# Patient Record
Sex: Female | Born: 1975
Health system: Southern US, Community
[De-identification: ages and names within clinical notes are randomized; demographics above are authoritative.]

## PROBLEM LIST (undated history)

## (undated) ENCOUNTER — Emergency Department (HOSPITAL_COMMUNITY): Payer: No Typology Code available for payment source

## (undated) DIAGNOSIS — Z789 Other specified health status: Secondary | ICD-10-CM

## (undated) HISTORY — PX: NO PAST SURGERIES: SHX2092

## (undated) HISTORY — DX: Other specified health status: Z78.9

---

## 1998-11-04 ENCOUNTER — Emergency Department (HOSPITAL_COMMUNITY): Admission: EM | Admit: 1998-11-04 | Discharge: 1998-11-04 | Payer: Self-pay | Admitting: Emergency Medicine

## 1999-06-19 ENCOUNTER — Other Ambulatory Visit: Admission: RE | Admit: 1999-06-19 | Discharge: 1999-06-19 | Payer: Self-pay | Admitting: Internal Medicine

## 2000-10-20 ENCOUNTER — Other Ambulatory Visit: Admission: RE | Admit: 2000-10-20 | Discharge: 2000-10-20 | Payer: Self-pay | Admitting: Internal Medicine

## 2001-01-04 ENCOUNTER — Other Ambulatory Visit: Admission: RE | Admit: 2001-01-04 | Discharge: 2001-01-04 | Payer: Self-pay | Admitting: Internal Medicine

## 2001-06-26 ENCOUNTER — Emergency Department (HOSPITAL_COMMUNITY): Admission: EM | Admit: 2001-06-26 | Discharge: 2001-06-26 | Payer: Self-pay

## 2001-11-04 ENCOUNTER — Inpatient Hospital Stay (HOSPITAL_COMMUNITY): Admission: AD | Admit: 2001-11-04 | Discharge: 2001-11-04 | Payer: Self-pay | Admitting: *Deleted

## 2001-12-09 ENCOUNTER — Other Ambulatory Visit: Admission: RE | Admit: 2001-12-09 | Discharge: 2001-12-09 | Payer: Self-pay | Admitting: *Deleted

## 2002-07-03 ENCOUNTER — Inpatient Hospital Stay (HOSPITAL_COMMUNITY): Admission: AD | Admit: 2002-07-03 | Discharge: 2002-07-05 | Payer: Self-pay | Admitting: *Deleted

## 2003-01-24 ENCOUNTER — Emergency Department (HOSPITAL_COMMUNITY): Admission: EM | Admit: 2003-01-24 | Discharge: 2003-01-24 | Payer: Self-pay | Admitting: Emergency Medicine

## 2003-12-24 ENCOUNTER — Ambulatory Visit (HOSPITAL_COMMUNITY): Admission: RE | Admit: 2003-12-24 | Discharge: 2003-12-24 | Payer: Self-pay | Admitting: Obstetrics

## 2004-09-13 ENCOUNTER — Emergency Department (HOSPITAL_COMMUNITY): Admission: EM | Admit: 2004-09-13 | Discharge: 2004-09-13 | Payer: Self-pay | Admitting: Emergency Medicine

## 2005-02-14 ENCOUNTER — Emergency Department (HOSPITAL_COMMUNITY): Admission: EM | Admit: 2005-02-14 | Discharge: 2005-02-14 | Payer: Self-pay | Admitting: Emergency Medicine

## 2005-09-07 ENCOUNTER — Emergency Department (HOSPITAL_COMMUNITY): Admission: EM | Admit: 2005-09-07 | Discharge: 2005-09-08 | Payer: Self-pay | Admitting: Emergency Medicine

## 2005-11-03 ENCOUNTER — Emergency Department (HOSPITAL_COMMUNITY): Admission: EM | Admit: 2005-11-03 | Discharge: 2005-11-03 | Payer: Self-pay | Admitting: *Deleted

## 2005-11-05 ENCOUNTER — Encounter: Admission: RE | Admit: 2005-11-05 | Discharge: 2005-11-05 | Payer: Self-pay | Admitting: General Practice

## 2005-11-09 ENCOUNTER — Encounter: Admission: RE | Admit: 2005-11-09 | Discharge: 2005-11-26 | Payer: Self-pay | Admitting: General Practice

## 2009-05-30 ENCOUNTER — Ambulatory Visit (HOSPITAL_COMMUNITY): Admission: RE | Admit: 2009-05-30 | Discharge: 2009-05-30 | Payer: Self-pay | Admitting: Obstetrics & Gynecology

## 2009-08-20 ENCOUNTER — Ambulatory Visit (HOSPITAL_COMMUNITY): Admission: RE | Admit: 2009-08-20 | Discharge: 2009-08-20 | Payer: Self-pay | Admitting: Obstetrics & Gynecology

## 2009-10-27 ENCOUNTER — Inpatient Hospital Stay (HOSPITAL_COMMUNITY): Admission: AD | Admit: 2009-10-27 | Discharge: 2009-10-29 | Payer: Self-pay | Admitting: Obstetrics

## 2010-04-24 LAB — CBC
HCT: 38 % (ref 36.0–46.0)
Hemoglobin: 12.7 g/dL (ref 12.0–15.0)
MCH: 31.6 pg (ref 26.0–34.0)
MCHC: 33.4 g/dL (ref 30.0–36.0)
RDW: 15.3 % (ref 11.5–15.5)

## 2010-04-24 LAB — RPR: RPR Ser Ql: NONREACTIVE

## 2012-02-10 NOTE — L&D Delivery Note (Signed)
Delivery Note At 6:34 AM a viable female was delivered by nurse midwife precipitously on admission to triage via Vaginal, Spontaneous Delivery (Presentation: vertex ;  ).  APGAR: 8, 9; weight .   Placenta status: , Spontaneous.  Cord: 3 vessels with the following complications: None.  Cord pH: none  Anesthesia: None  Episiotomy: None Lacerations: 2nd degree Suture Repair: 3.0 vicryl rapide Est. Blood Loss (mL): 600  Mom to postpartum.  Baby to Couplet care / Skin to Skin.  Jamey Harman A 12/14/2012, 7:47 AM

## 2012-05-12 ENCOUNTER — Encounter: Payer: Self-pay | Admitting: *Deleted

## 2012-05-12 ENCOUNTER — Ambulatory Visit (INDEPENDENT_AMBULATORY_CARE_PROVIDER_SITE_OTHER): Payer: Managed Care, Other (non HMO) | Admitting: *Deleted

## 2012-05-12 VITALS — BP 108/72 | HR 85 | Temp 97.7°F | Ht 64.0 in | Wt 174.0 lb

## 2012-05-12 DIAGNOSIS — N912 Amenorrhea, unspecified: Secondary | ICD-10-CM

## 2012-05-12 NOTE — Progress Notes (Signed)
Pt reports positive home UPT.

## 2012-06-16 ENCOUNTER — Encounter: Payer: Managed Care, Other (non HMO) | Admitting: Obstetrics & Gynecology

## 2012-06-22 ENCOUNTER — Other Ambulatory Visit: Payer: Self-pay | Admitting: Obstetrics & Gynecology

## 2012-06-22 ENCOUNTER — Ambulatory Visit (INDEPENDENT_AMBULATORY_CARE_PROVIDER_SITE_OTHER): Payer: Managed Care, Other (non HMO) | Admitting: Obstetrics & Gynecology

## 2012-06-22 ENCOUNTER — Encounter: Payer: Self-pay | Admitting: Obstetrics & Gynecology

## 2012-06-22 VITALS — BP 106/72 | Temp 97.9°F | Wt 175.0 lb

## 2012-06-22 DIAGNOSIS — Z3201 Encounter for pregnancy test, result positive: Secondary | ICD-10-CM

## 2012-06-22 DIAGNOSIS — Z3689 Encounter for other specified antenatal screening: Secondary | ICD-10-CM

## 2012-06-22 DIAGNOSIS — IMO0002 Reserved for concepts with insufficient information to code with codable children: Secondary | ICD-10-CM | POA: Insufficient documentation

## 2012-06-22 DIAGNOSIS — O09529 Supervision of elderly multigravida, unspecified trimester: Secondary | ICD-10-CM

## 2012-06-22 DIAGNOSIS — Z3482 Encounter for supervision of other normal pregnancy, second trimester: Secondary | ICD-10-CM

## 2012-06-22 DIAGNOSIS — Z3480 Encounter for supervision of other normal pregnancy, unspecified trimester: Secondary | ICD-10-CM | POA: Insufficient documentation

## 2012-06-22 LAB — POCT URINALYSIS DIPSTICK
Blood, UA: NEGATIVE
Glucose, UA: NEGATIVE
Urobilinogen, UA: NEGATIVE

## 2012-06-22 NOTE — Progress Notes (Signed)
Pulse- 87 Pt states she is having pain in he lower abdomen on her left side.   Subjective:    Andrea Dougherty is being seen today for her first obstetrical visit.  This is not a planned pregnancy. She is at [redacted]w[redacted]d gestation. Her obstetrical history is significant for advanced maternal age. Relationship with FOB: spouse, living together. Patient does intend to breast feed. Pregnancy history fully reviewed.  Menstrual History: OB History   Grav Para Term Preterm Abortions TAB SAB Ect Mult Living   3 2 2       2       Menarche age: 28  Patient's last menstrual period was 03/16/2012.    The following portions of the patient's history were reviewed and updated as appropriate: allergies, current medications, past family history, past medical history, past social history, past surgical history and problem list.  Review of Systems Pertinent items are noted in HPI.    Objective:   General Appearance:    Alert, cooperative, no distress, appears stated age  Head:    Normocephalic, without obvious abnormality, atraumatic  Eyes:    PERRL, conjunctiva/corneas clear, EOM's intact, fundi    benign, both eyes  Ears:    Normal TM's and external ear canals, both ears  Nose:   Nares normal, septum midline, mucosa normal, no drainage    or sinus tenderness  Throat:   Lips, mucosa, and tongue normal; teeth and gums normal  Neck:   Supple, symmetrical, trachea midline, no adenopathy;    thyroid:  no enlargement/tenderness/nodules; no carotid   bruit or JVD  Back:     Symmetric, no curvature, ROM normal, no CVA tenderness  Lungs:     Clear to auscultation bilaterally, respirations unlabored  Chest Wall:    No tenderness or deformity   Heart:    Regular rate and rhythm, S1 and S2 normal, no murmur, rub   or gallop  Breast Exam:    No tenderness, masses, or nipple abnormality  Abdomen:     Soft, non-tender, bowel sounds active all four quadrants,    no masses, no organomegaly  Genitalia:    Normal  female without lesion, discharge or tenderness  Extremities:   Extremities normal, atraumatic, no cyanosis or edema  Pulses:   2+ and symmetric all extremities  Skin:   Skin color, texture, turgor normal, no rashes or lesions  Lymph nodes:   Cervical, supraclavicular, and axillary nodes normal  Neurologic:   CNII-XII intact, normal strength, sensation and reflexes    throughout    Assessment:    Pregnancy at [redacted]w[redacted]d weeks    Plan:    Initial labs drawn. Prenatal vitamins. Problem list reviewed and updated. AFP3 discussed: declined. Role of ultrasound in pregnancy discussed; fetal survey: requested. NIPT/Amniocentesis discussed: declined. Follow up in 6 weeks. 50% of 20 min visit spent on counseling and coordination of care.

## 2012-06-22 NOTE — Patient Instructions (Signed)
Pregnancy - Second Trimester The second trimester of pregnancy (3 to 6 months) is a period of rapid growth for you and your baby. At the end of the sixth month, your baby is about 9 inches long and weighs 1 1/2 pounds. You will begin to feel the baby move between 18 and 20 weeks of the pregnancy. This is called quickening. Weight gain is faster. A clear fluid (colostrum) may leak out of your breasts. You may feel small contractions of the womb (uterus). This is known as false labor or Braxton-Hicks contractions. This is like a practice for labor when the baby is ready to be born. Usually, the problems with morning sickness have usually passed by the end of your first trimester. Some women develop small dark blotches (called cholasma, mask of pregnancy) on their face that usually goes away after the baby is born. Exposure to the sun makes the blotches worse. Acne may also develop in some pregnant women and pregnant women who have acne, may find that it goes away. PRENATAL EXAMS  Blood work may continue to be done during prenatal exams. These tests are done to check on your health and the probable health of your baby. Blood work is used to follow your blood levels (hemoglobin). Anemia (low hemoglobin) is common during pregnancy. Iron and vitamins are given to help prevent this. You will also be checked for diabetes between 24 and 28 weeks of the pregnancy. Some of the previous blood tests may be repeated.  The size of the uterus is measured during each visit. This is to make sure that the baby is continuing to grow properly according to the dates of the pregnancy.  Your blood pressure is checked every prenatal visit. This is to make sure you are not getting toxemia.  Your urine is checked to make sure you do not have an infection, diabetes or protein in the urine.  Your weight is checked often to make sure gains are happening at the suggested rate. This is to ensure that both you and your baby are growing  normally.  Sometimes, an ultrasound is performed to confirm the proper growth and development of the baby. This is a test which bounces harmless sound waves off the baby so your caregiver can more accurately determine due dates. Sometimes, a specialized test is done on the amniotic fluid surrounding the baby. This test is called an amniocentesis. The amniotic fluid is obtained by sticking a needle into the belly (abdomen). This is done to check the chromosomes in instances where there is a concern about possible genetic problems with the baby. It is also sometimes done near the end of pregnancy if an early delivery is required. In this case, it is done to help make sure the baby's lungs are mature enough for the baby to live outside of the womb. CHANGES OCCURING IN THE SECOND TRIMESTER OF PREGNANCY Your body goes through many changes during pregnancy. They vary from person to person. Talk to your caregiver about changes you notice that you are concerned about.  During the second trimester, you will likely have an increase in your appetite. It is normal to have cravings for certain foods. This varies from person to person and pregnancy to pregnancy.  Your lower abdomen will begin to bulge.  You may have to urinate more often because the uterus and baby are pressing on your bladder. It is also common to get more bladder infections during pregnancy (pain with urination). You can help this by   drinking lots of fluids and emptying your bladder before and after intercourse.  You may begin to get stretch marks on your hips, abdomen, and breasts. These are normal changes in the body during pregnancy. There are no exercises or medications to take that prevent this change.  You may begin to develop swollen and bulging veins (varicose veins) in your legs. Wearing support hose, elevating your feet for 15 minutes, 3 to 4 times a day and limiting salt in your diet helps lessen the problem.  Heartburn may develop  as the uterus grows and pushes up against the stomach. Antacids recommended by your caregiver helps with this problem. Also, eating smaller meals 4 to 5 times a day helps.  Constipation can be treated with a stool softener or adding bulk to your diet. Drinking lots of fluids, vegetables, fruits, and whole grains are helpful.  Exercising is also helpful. If you have been very active up until your pregnancy, most of these activities can be continued during your pregnancy. If you have been less active, it is helpful to start an exercise program such as walking.  Hemorrhoids (varicose veins in the rectum) may develop at the end of the second trimester. Warm sitz baths and hemorrhoid cream recommended by your caregiver helps hemorrhoid problems.  Backaches may develop during this time of your pregnancy. Avoid heavy lifting, wear low heal shoes and practice good posture to help with backache problems.  Some pregnant women develop tingling and numbness of their hand and fingers because of swelling and tightening of ligaments in the wrist (carpel tunnel syndrome). This goes away after the baby is born.  As your breasts enlarge, you may have to get a bigger bra. Get a comfortable, cotton, support bra. Do not get a nursing bra until the last month of the pregnancy if you will be nursing the baby.  You may get a dark line from your belly button to the pubic area called the linea nigra.  You may develop rosy cheeks because of increase blood flow to the face.  You may develop spider looking lines of the face, neck, arms and chest. These go away after the baby is born. HOME CARE INSTRUCTIONS   It is extremely important to avoid all smoking, herbs, alcohol, and unprescribed drugs during your pregnancy. These chemicals affect the formation and growth of the baby. Avoid these chemicals throughout the pregnancy to ensure the delivery of a healthy infant.  Most of your home care instructions are the same as  suggested for the first trimester of your pregnancy. Keep your caregiver's appointments. Follow your caregiver's instructions regarding medication use, exercise and diet.  During pregnancy, you are providing food for you and your baby. Continue to eat regular, well-balanced meals. Choose foods such as meat, fish, milk and other low fat dairy products, vegetables, fruits, and whole-grain breads and cereals. Your caregiver will tell you of the ideal weight gain.  A physical sexual relationship may be continued up until near the end of pregnancy if there are no other problems. Problems could include early (premature) leaking of amniotic fluid from the membranes, vaginal bleeding, abdominal pain, or other medical or pregnancy problems.  Exercise regularly if there are no restrictions. Check with your caregiver if you are unsure of the safety of some of your exercises. The greatest weight gain will occur in the last 2 trimesters of pregnancy. Exercise will help you:  Control your weight.  Get you in shape for labor and delivery.  Lose weight   after you have the baby.  Wear a good support or jogging bra for breast tenderness during pregnancy. This may help if worn during sleep. Pads or tissues may be used in the bra if you are leaking colostrum.  Do not use hot tubs, steam rooms or saunas throughout the pregnancy.  Wear your seat belt at all times when driving. This protects you and your baby if you are in an accident.  Avoid raw meat, uncooked cheese, cat litter boxes and soil used by cats. These carry germs that can cause birth defects in the baby.  The second trimester is also a good time to visit your dentist for your dental health if this has not been done yet. Getting your teeth cleaned is OK. Use a soft toothbrush. Brush gently during pregnancy.  It is easier to loose urine during pregnancy. Tightening up and strengthening the pelvic muscles will help with this problem. Practice stopping your  urination while you are going to the bathroom. These are the same muscles you need to strengthen. It is also the muscles you would use as if you were trying to stop from passing gas. You can practice tightening these muscles up 10 times a set and repeating this about 3 times per day. Once you know what muscles to tighten up, do not perform these exercises during urination. It is more likely to contribute to an infection by backing up the urine.  Ask for help if you have financial, counseling or nutritional needs during pregnancy. Your caregiver will be able to offer counseling for these needs as well as refer you for other special needs.  Your skin may become oily. If so, wash your face with mild soap, use non-greasy moisturizer and oil or cream based makeup. MEDICATIONS AND DRUG USE IN PREGNANCY  Take prenatal vitamins as directed. The vitamin should contain 1 milligram of folic acid. Keep all vitamins out of reach of children. Only a couple vitamins or tablets containing iron may be fatal to a baby or young child when ingested.  Avoid use of all medications, including herbs, over-the-counter medications, not prescribed or suggested by your caregiver. Only take over-the-counter or prescription medicines for pain, discomfort, or fever as directed by your caregiver. Do not use aspirin.  Let your caregiver also know about herbs you may be using.  Alcohol is related to a number of birth defects. This includes fetal alcohol syndrome. All alcohol, in any form, should be avoided completely. Smoking will cause low birth rate and premature babies.  Street or illegal drugs are very harmful to the baby. They are absolutely forbidden. A baby born to an addicted mother will be addicted at birth. The baby will go through the same withdrawal an adult does. SEEK MEDICAL CARE IF:  You have any concerns or worries during your pregnancy. It is better to call with your questions if you feel they cannot wait, rather  than worry about them. SEEK IMMEDIATE MEDICAL CARE IF:   An unexplained oral temperature above 102 F (38.9 C) develops, or as your caregiver suggests.  You have leaking of fluid from the vagina (birth canal). If leaking membranes are suspected, take your temperature and tell your caregiver of this when you call.  There is vaginal spotting, bleeding, or passing clots. Tell your caregiver of the amount and how many pads are used. Light spotting in pregnancy is common, especially following intercourse.  You develop a bad smelling vaginal discharge with a change in the color from clear   to white.  You continue to feel sick to your stomach (nauseated) and have no relief from remedies suggested. You vomit blood or coffee ground-like materials.  You lose more than 2 pounds of weight or gain more than 2 pounds of weight over 1 week, or as suggested by your caregiver.  You notice swelling of your face, hands, feet, or legs.  You get exposed to [place] measles and have never had them.  You are exposed to fifth disease or chickenpox.  You develop belly (abdominal) pain. Round ligament discomfort is a common non-cancerous (benign) cause of abdominal pain in pregnancy. Your caregiver still must evaluate you.  You develop a bad headache that does not go away.  You develop fever, diarrhea, pain with urination, or shortness of breath.  You develop visual problems, blurry, or double vision.  You fall or are in a car accident or any kind of trauma.  There is mental or physical violence at home. Document Released: 01/20/2001 Document Revised: 04/20/2011 Document Reviewed: 07/25/2008 [employer] Patient Information 2013 [employer].  

## 2012-06-23 LAB — OBSTETRIC PANEL
Antibody Screen: NEGATIVE
Basophils Absolute: 0 10*3/uL (ref 0.0–0.1)
Basophils Relative: 1 % (ref 0–1)
Eosinophils Absolute: 0.6 10*3/uL (ref 0.0–0.7)
HCT: 38.5 % (ref 36.0–46.0)
Hepatitis B Surface Ag: NEGATIVE
Lymphs Abs: 1.9 10*3/uL (ref 0.7–4.0)
Monocytes Absolute: 0.5 10*3/uL (ref 0.1–1.0)
Monocytes Relative: 6 % (ref 3–12)
RBC: 4.28 MIL/uL (ref 3.87–5.11)
RDW: 14 % (ref 11.5–15.5)
Rh Type: POSITIVE

## 2012-06-23 LAB — PAP IG, CT-NG, RFX HPV ASCU
Chlamydia Probe Amp: NEGATIVE
GC Probe Amp: NEGATIVE

## 2012-06-23 LAB — HEMOGLOBIN A1C: Mean Plasma Glucose: 108 mg/dL (ref <117)

## 2012-06-24 LAB — CULTURE, OB URINE: Colony Count: NO GROWTH

## 2012-06-27 LAB — HEMOGLOBINOPATHY EVALUATION
Hemoglobin Other: 0 %
Hgb F Quant: 0 % (ref 0.0–2.0)

## 2012-06-28 ENCOUNTER — Ambulatory Visit (HOSPITAL_COMMUNITY)
Admission: RE | Admit: 2012-06-28 | Discharge: 2012-06-28 | Disposition: A | Discharge status: Home / Self Care | Payer: Managed Care, Other (non HMO) | Source: Ambulatory Visit | Attending: Obstetrics & Gynecology | Admitting: Obstetrics & Gynecology

## 2012-06-28 ENCOUNTER — Encounter: Payer: Self-pay | Admitting: Obstetrics & Gynecology

## 2012-06-28 DIAGNOSIS — E559 Vitamin D deficiency, unspecified: Secondary | ICD-10-CM | POA: Insufficient documentation

## 2012-06-28 DIAGNOSIS — Z3689 Encounter for other specified antenatal screening: Secondary | ICD-10-CM

## 2012-07-01 ENCOUNTER — Other Ambulatory Visit: Payer: Self-pay | Admitting: *Deleted

## 2012-07-01 DIAGNOSIS — Z3482 Encounter for supervision of other normal pregnancy, second trimester: Secondary | ICD-10-CM

## 2012-07-01 MED ORDER — VITAFOL ULTRA 29-0.6-0.4-200 MG PO CAPS: 1.0000 | ORAL_CAPSULE | Freq: Every day | ORAL | Status: DC

## 2012-07-20 ENCOUNTER — Encounter: Payer: Self-pay | Admitting: Obstetrics & Gynecology

## 2012-07-20 ENCOUNTER — Ambulatory Visit (INDEPENDENT_AMBULATORY_CARE_PROVIDER_SITE_OTHER): Payer: Managed Care, Other (non HMO) | Admitting: Obstetrics & Gynecology

## 2012-07-20 VITALS — BP 102/68 | Temp 98.4°F | Wt 175.0 lb

## 2012-07-20 DIAGNOSIS — Z3480 Encounter for supervision of other normal pregnancy, unspecified trimester: Secondary | ICD-10-CM

## 2012-07-20 NOTE — Patient Instructions (Signed)
Pregnancy - Second Trimester The second trimester is the period between 13 to 27 weeks of your pregnancy. It is important to follow your doctor's instructions. HOME CARE   Do not smoke.  Do not drink alcohol or use drugs.  Only take medicine as told by your doctor.  Take prenatal vitamins as told. The vitamin should contain 1 milligram of folic acid.  Exercise.  Eat healthy foods. Eat regular, well-balanced meals.  You can have sex (intercourse) if there are no other problems with the pregnancy.  Do not use hot tubs, steam rooms, or saunas.  Wear a seat belt while driving.  Avoid raw meat, uncooked cheese, and litter boxes and soil used by cats.  Visit your dentist. Cleanings are okay. GET HELP RIGHT AWAY IF:   You have a temperature by mouth above 102 F (38.9 C), not controlled by medicine.  Fluid is coming from your vagina.  Blood is coming from your vagina. Light spotting is common, especially after sex (intercourse).  You have a bad smelling fluid (discharge) coming from the vagina. The fluid changes from clear to white.  You still feel sick to your stomach (nauseous).  You throw up (vomit) blood.  You lose or gain more than 2 pounds (0.9 kilograms) of weight in a week, or as suggested by your doctor.  Your face, hands, feet, or legs get puffy (swell).  You get exposed to [place] measles and have never had them.  You get exposed to fifth disease or chickenpox.  You have belly (abdominal) pain.  You have a bad headache that will not go away.  You have watery poop (diarrhea), pain when you pee (urinate), or have shortness of breath.  You start to have problems seeing (blurry or double vision).  You fall, are in a car accident, or have any kind of trauma.  There is mental or physical violence at home.  You have any concerns or worries during your pregnancy. MAKE SURE YOU:   Understand these instructions.  Will watch your condition.  Will get help  right away if you are not doing well or get worse. Document Released: 04/22/2009 Document Revised: 04/20/2011 Document Reviewed: 04/22/2009 [employer] Patient Information 2014 [employer].  

## 2012-07-20 NOTE — Progress Notes (Signed)
Pulse-85  No complaints.

## 2012-07-20 NOTE — Progress Notes (Signed)
Doing well 

## 2012-07-26 ENCOUNTER — Other Ambulatory Visit: Payer: Self-pay | Admitting: *Deleted

## 2012-07-26 DIAGNOSIS — Z1389 Encounter for screening for other disorder: Secondary | ICD-10-CM

## 2012-08-03 ENCOUNTER — Ambulatory Visit (INDEPENDENT_AMBULATORY_CARE_PROVIDER_SITE_OTHER): Payer: Managed Care, Other (non HMO)

## 2012-08-03 ENCOUNTER — Other Ambulatory Visit: Payer: Managed Care, Other (non HMO)

## 2012-08-03 ENCOUNTER — Encounter: Payer: Self-pay | Admitting: Obstetrics & Gynecology

## 2012-08-03 DIAGNOSIS — Z1389 Encounter for screening for other disorder: Secondary | ICD-10-CM

## 2012-08-03 DIAGNOSIS — O09529 Supervision of elderly multigravida, unspecified trimester: Secondary | ICD-10-CM

## 2012-08-03 LAB — US OB DETAIL + 14 WK

## 2012-08-10 ENCOUNTER — Encounter: Payer: Self-pay | Admitting: Obstetrics & Gynecology

## 2012-08-14 ENCOUNTER — Other Ambulatory Visit: Payer: Self-pay | Admitting: *Deleted

## 2012-08-14 DIAGNOSIS — Z3482 Encounter for supervision of other normal pregnancy, second trimester: Secondary | ICD-10-CM

## 2012-08-16 ENCOUNTER — Other Ambulatory Visit: Payer: Managed Care, Other (non HMO)

## 2012-08-24 ENCOUNTER — Ambulatory Visit (INDEPENDENT_AMBULATORY_CARE_PROVIDER_SITE_OTHER): Payer: Managed Care, Other (non HMO) | Admitting: Obstetrics & Gynecology

## 2012-08-24 VITALS — BP 115/79 | Temp 98.7°F | Wt 179.0 lb

## 2012-08-24 DIAGNOSIS — Z3480 Encounter for supervision of other normal pregnancy, unspecified trimester: Secondary | ICD-10-CM

## 2012-08-24 DIAGNOSIS — Z3482 Encounter for supervision of other normal pregnancy, second trimester: Secondary | ICD-10-CM

## 2012-08-24 NOTE — Progress Notes (Signed)
P 84 Patient c/o pain in lower abdomen that can be severe at times. Patient also c/o constipation.

## 2012-08-25 ENCOUNTER — Encounter: Payer: Self-pay | Admitting: Obstetrics & Gynecology

## 2012-08-25 NOTE — Patient Instructions (Addendum)
Pregnancy - Second Trimester The second trimester is the period between 13 to 27 weeks of your pregnancy. It is important to follow your doctor's instructions. HOME CARE   Do not smoke.  Do not drink alcohol or use drugs.  Only take medicine as told by your doctor.  Take prenatal vitamins as told. The vitamin should contain 1 milligram of folic acid.  Exercise.  Eat healthy foods. Eat regular, well-balanced meals.  You can have sex (intercourse) if there are no other problems with the pregnancy.  Do not use hot tubs, steam rooms, or saunas.  Wear a seat belt while driving.  Avoid raw meat, uncooked cheese, and litter boxes and soil used by cats.  Visit your dentist. Shirlee [place] are okay. GET HELP RIGHT AWAY IF:   You have a temperature by mouth above 102 F (38.9 C), not controlled by medicine.  Fluid is coming from your vagina.  Blood is coming from your vagina. Light spotting is common, especially after sex (intercourse).  You have a bad smelling fluid (discharge) coming from the vagina. The fluid changes from clear to white.  You still feel sick to your stomach (nauseous).  You throw up (vomit) blood.  You lose or gain more than 2 pounds (0.9 kilograms) of weight in a week, or as suggested by your doctor.  Your face, hands, feet, or legs get puffy (swell).  You get exposed to [place] measles and have never had them.  You get exposed to fifth disease or chickenpox.  You have belly (abdominal) pain.  You have a bad headache that will not go away.  You have watery poop (diarrhea), pain when you pee (urinate), or have shortness of breath.  You start to have problems seeing (blurry or double vision).  You fall, are in a car accident, or have any kind of trauma.  There is mental or physical violence at home.  You have any concerns or worries during your pregnancy. MAKE SURE YOU:   Understand these instructions.  Will watch your condition.  Will get help  right away if you are not doing well or get worse. Document Released: 04/22/2009 Document Revised: 04/20/2011 Document Reviewed: 04/22/2009 [facility] Patient Information 2014 [place], [place]. Glucose Tolerance Test This is a test to see how your body processes carbohydrates. This test is often done to check patients for diabetes or the possibility of developing it. PREPARATION FOR TEST You should have nothing to eat or drink 12 hours before the test. You will be given a form of sugar (glucose) and then blood samples will be drawn from your vein to determine the level of sugar in your blood. Alternatively, blood may be drawn from your finger for testing. You should not smoke or exercise during the test. NORMAL FINDINGS  Fasting: 70-115 mg/dL  30 minutes: less than 200 mg/dL  1 hour: less than 045 mg/dL  2 hours: less than 409 mg/dL  3 hours: 81-191 mg/dL  4 hours: 47-829 mg/dL Ranges for normal findings may vary among different laboratories and hospitals. You should always check with your doctor after having lab work or other tests done to discuss the meaning of your test results and whether your values are considered within normal limits. MEANING OF TEST Your caregiver will go over the test results with you and discuss the importance and meaning of your results, as well as treatment options and the need for additional tests. OBTAINING THE TEST RESULTS It is your responsibility to obtain your test results. Ask  the lab or department performing the test when and how you will get your results. Document Released: 02/19/2004 Document Revised: 04/20/2011 Document Reviewed: 01/07/2008 [facility] Patient Information 2014 [place], [place].

## 2012-08-25 NOTE — Progress Notes (Signed)
Most likely round ligament pain/musculoskeletal.

## 2012-08-30 ENCOUNTER — Encounter: Payer: Self-pay | Admitting: Obstetrics & Gynecology

## 2012-09-21 ENCOUNTER — Encounter: Payer: Managed Care, Other (non HMO) | Admitting: Obstetrics & Gynecology

## 2012-09-21 ENCOUNTER — Other Ambulatory Visit: Payer: Managed Care, Other (non HMO)

## 2012-09-21 ENCOUNTER — Ambulatory Visit (INDEPENDENT_AMBULATORY_CARE_PROVIDER_SITE_OTHER): Payer: Managed Care, Other (non HMO) | Admitting: Obstetrics & Gynecology

## 2012-09-21 VITALS — BP 120/76 | Temp 97.9°F | Wt 187.2 lb

## 2012-09-21 DIAGNOSIS — Z3482 Encounter for supervision of other normal pregnancy, second trimester: Secondary | ICD-10-CM

## 2012-09-21 DIAGNOSIS — Z3480 Encounter for supervision of other normal pregnancy, unspecified trimester: Secondary | ICD-10-CM

## 2012-09-21 LAB — CBC
Hemoglobin: 11.8 g/dL — ABNORMAL LOW (ref 12.0–15.0)
MCV: 91.9 fL (ref 78.0–100.0)
Platelets: 243 10*3/uL (ref 150–400)

## 2012-09-21 NOTE — Progress Notes (Signed)
Pulse- 101.  Patient c/o heart palpitations.

## 2012-09-22 LAB — GLUCOSE TOLERANCE, 2 HOURS W/ 1HR: Glucose, 2 hour: 74 mg/dL (ref 70–139)

## 2012-10-06 ENCOUNTER — Encounter: Payer: Managed Care, Other (non HMO) | Admitting: Obstetrics & Gynecology

## 2012-10-07 ENCOUNTER — Encounter: Payer: Managed Care, Other (non HMO) | Admitting: Obstetrics & Gynecology

## 2012-10-20 ENCOUNTER — Encounter: Payer: Self-pay | Admitting: Obstetrics & Gynecology

## 2012-10-20 ENCOUNTER — Encounter: Payer: Managed Care, Other (non HMO) | Admitting: Obstetrics & Gynecology

## 2012-10-20 ENCOUNTER — Ambulatory Visit (INDEPENDENT_AMBULATORY_CARE_PROVIDER_SITE_OTHER): Payer: Managed Care, Other (non HMO) | Admitting: Obstetrics & Gynecology

## 2012-10-20 VITALS — BP 108/70 | Temp 97.7°F | Wt 181.0 lb

## 2012-10-20 DIAGNOSIS — Z3480 Encounter for supervision of other normal pregnancy, unspecified trimester: Secondary | ICD-10-CM

## 2012-10-20 DIAGNOSIS — Z3483 Encounter for supervision of other normal pregnancy, third trimester: Secondary | ICD-10-CM

## 2012-10-20 NOTE — Patient Instructions (Signed)
Contraception Choices  Contraception (birth control) is the use of any methods or devices to prevent pregnancy. Below are some methods to help avoid pregnancy.  HORMONAL METHODS   · Contraceptive implant. This is a thin, plastic tube containing progesterone hormone. It does not contain estrogen hormone. Your caregiver inserts the tube in the inner part of the upper arm. The tube can remain in place for up to 3 years. After 3 years, the implant must be removed. The implant prevents the ovaries from releasing an egg (ovulation), thickens the cervical mucus which prevents sperm from entering the uterus, and thins the lining of the inside of the uterus.  · Progesterone-only injections. These injections are given every 3 months by your caregiver to prevent pregnancy. This synthetic progesterone hormone stops the ovaries from releasing eggs. It also thickens cervical mucus and changes the uterine lining. This makes it harder for sperm to survive in the uterus.  · Birth control pills. These pills contain estrogen and progesterone hormone. They work by stopping the egg from forming in the ovary (ovulation). Birth control pills are prescribed by a caregiver. Birth control pills can also be used to treat heavy periods.  · Minipill. This type of birth control pill contains only the progesterone hormone. They are taken every day of each month and must be prescribed by your caregiver.  · Birth control patch. The patch contains hormones similar to those in birth control pills. It must be changed once a week and is prescribed by a caregiver.  · Vaginal ring. The ring contains hormones similar to those in birth control pills. It is left in the vagina for 3 weeks, removed for 1 week, and then a new one is put back in place. The patient must be comfortable inserting and removing the ring from the vagina. A caregiver's prescription is necessary.  · Emergency contraception. Emergency contraceptives prevent pregnancy after unprotected  sexual intercourse. This pill can be taken right after sex or up to 5 days after unprotected sex. It is most effective the sooner you take the pills after having sexual intercourse. Emergency contraceptive pills are available without a prescription. Check with your pharmacist. Do not use emergency contraception as your only form of birth control.  BARRIER METHODS   · Female condom. This is a thin sheath (latex or rubber) that is worn over the penis during sexual intercourse. It can be used with spermicide to increase effectiveness.  · Female condom. This is a soft, loose-fitting sheath that is put into the vagina before sexual intercourse.  · Diaphragm. This is a soft, latex, dome-shaped barrier that must be fitted by a caregiver. It is inserted into the vagina, along with a spermicidal jelly. It is inserted before intercourse. The diaphragm should be left in the vagina for 6 to 8 hours after intercourse.  · Cervical cap. This is a round, soft, latex or plastic cup that fits over the cervix and must be fitted by a caregiver. The cap can be left in place for up to 48 hours after intercourse.  · Sponge. This is a soft, circular piece of polyurethane foam. The sponge has spermicide in it. It is inserted into the vagina after wetting it and before sexual intercourse.  · Spermicides. These are chemicals that kill or block sperm from entering the cervix and uterus. They come in the form of creams, jellies, suppositories, foam, or tablets. They do not require a prescription. They are inserted into the vagina with an applicator before having sexual intercourse.   The process must be repeated every time you have sexual intercourse.  INTRAUTERINE CONTRACEPTION  · Intrauterine device (IUD). This is a T-shaped device that is put in a woman's uterus during a menstrual period to prevent pregnancy. There are 2 types:  · Copper IUD. This type of IUD is wrapped in copper wire and is placed inside the uterus. Copper makes the uterus and  fallopian tubes produce a fluid that kills sperm. It can stay in place for 10 years.  · Hormone IUD. This type of IUD contains the hormone progestin (synthetic progesterone). The hormone thickens the cervical mucus and prevents sperm from entering the uterus, and it also thins the uterine lining to prevent implantation of a fertilized egg. The hormone can weaken or kill the sperm that get into the uterus. It can stay in place for 5 years.  PERMANENT METHODS OF CONTRACEPTION  · Female tubal ligation. This is when the woman's fallopian tubes are surgically sealed, tied, or blocked to prevent the egg from traveling to the uterus.  · Female sterilization. This is when the female has the tubes that carry sperm tied off (vasectomy). This blocks sperm from entering the vagina during sexual intercourse. After the procedure, the man can still ejaculate fluid (semen).  NATURAL PLANNING METHODS  · Natural family planning. This is not having sexual intercourse or using a barrier method (condom, diaphragm, cervical cap) on days the woman could become pregnant.  · Calendar method. This is keeping track of the length of each menstrual cycle and identifying when you are fertile.  · Ovulation method. This is avoiding sexual intercourse during ovulation.  · Symptothermal method. This is avoiding sexual intercourse during ovulation, using a thermometer and ovulation symptoms.  · Post-ovulation method. This is timing sexual intercourse after you have ovulated.  Regardless of which type or method of contraception you choose, it is important that you use condoms to protect against the transmission of sexually transmitted diseases (STDs). Talk with your caregiver about which form of contraception is most appropriate for you.  Document Released: 01/26/2005 Document Revised: 04/20/2011 Document Reviewed: 06/04/2010  ExitCare® Patient Information ©2014 [employer].

## 2012-10-20 NOTE — Progress Notes (Signed)
Pulse: 91

## 2012-10-20 NOTE — Progress Notes (Signed)
Doing well.  Already has received flu vaccine.

## 2012-11-03 ENCOUNTER — Encounter: Payer: Managed Care, Other (non HMO) | Admitting: Obstetrics & Gynecology

## 2012-11-17 ENCOUNTER — Ambulatory Visit (INDEPENDENT_AMBULATORY_CARE_PROVIDER_SITE_OTHER): Payer: Managed Care, Other (non HMO) | Admitting: Obstetrics & Gynecology

## 2012-11-17 VITALS — BP 107/67 | Temp 97.3°F | Wt 183.0 lb

## 2012-11-17 DIAGNOSIS — Z3483 Encounter for supervision of other normal pregnancy, third trimester: Secondary | ICD-10-CM

## 2012-11-17 DIAGNOSIS — Z3480 Encounter for supervision of other normal pregnancy, unspecified trimester: Secondary | ICD-10-CM

## 2012-11-17 NOTE — Progress Notes (Signed)
Pulse- 87 Pt states she is having pain and pressure in lower abdomen and in both sides.

## 2012-11-19 LAB — STREP B DNA PROBE: GBSP: NEGATIVE

## 2012-11-20 ENCOUNTER — Encounter: Payer: Self-pay | Admitting: Obstetrics & Gynecology

## 2012-11-20 NOTE — Patient Instructions (Signed)
Patient information: Group B streptococcus and pregnancy (Beyond the Basics)  Authors Karen M Puopolo, MD, PhD Carol J Baker, MD Section Editors Charles J Lockwood, MD Daniel J Sexton, MD Deputy Editor Vanessa A Barss, MD Disclosures  All topics are updated as new evidence becomes available and our peer review process is complete.  Literature review current through: Feb 2014.  This topic last updated: Aug 10, 2011.  INTRODUCTION - Group B streptococcus (GBS) is a bacterium that can cause serious infections in pregnant women and newborn babies. GBS is one of many types of streptococcal bacteria, sometimes called "strep." This article discusses GBS, its effect on pregnant women and infants, and ways to prevent complications of GBS. More detailed information about GBS is available by subscription. (See "Group B streptococcal infection in pregnant women".) WHAT IS GROUP B STREP INFECTION? - GBS is commonly found in the digestive system and the vagina. In healthy adults, GBS is not harmful and does not cause problems. But in pregnant women and newborn infants, being infected with GBS can cause serious illness. Approximately one in three to four pregnant women in the [place] carries GBS in their gastrointestinal system and/or in their vagina. Carrying GBS is not the same as being infected. Carriers are not sick and do not need treatment during pregnancy. There is no treatment that can stop you from carrying GBS.  Pregnant women who are carriers of GBS infrequently become infected with GBS. GBS can cause urinary tract infections, infection of the amniotic fluid (bag of water), and infection of the uterus after delivery. GBS infections during pregnancy may lead to preterm labor.  Pregnant women who carry GBS can pass on the bacteria to their newborns, and some of those babies become infected with GBS. Newborns who are infected with GBS can develop pneumonia (lung infection), septicemia (blood infection), or  meningitis (infection of the lining of the brain and spinal cord). These complications can be prevented by giving intravenous antibiotics during labor to any woman who is at risk of GBS infection. You are at risk of GBS infection if: You have a urine culture during your current pregnancy showing GBS  You have a vaginal and rectal culture during your current pregnancy showing GBS  You had an infant infected with GBS in the past GROUP B STREP PREVENTION - Most doctors and nurses recommend a urine culture early in your pregnancy to be sure that you do not have a bladder infection without symptoms. If you urine culture shows GBS or other bacteria, you may be treated with an antibiotic. If you have symptoms of urinary infection, such as pain with urination, any time during your pregnancy, a urine culture is done. If GBS grows from the urine culture, it should be treated with an antibiotic, and you should also receive intravenous antibiotics during labor. Expert groups recommend that all pregnant women have a GBS culture at 35 to 37 weeks of pregnancy. The culture is done by swabbing the vagina and rectum. If your GBS culture is positive, you will be given an intravenous antibiotic during labor. If you have preterm labor, the culture is done then and an intravenous antibiotic is given until the baby is born or the labor is stopped by your health care provider. If you have a positive GBS culture and you have an allergy to penicillin, be sure your doctor and nurse are aware of this allergy and tell them what happened with the allergy. If you had only a rash or itching, this   is not a serious allergy, and you can receive a common drug related to the penicillin. If you had a serious allergy (for example, trouble breathing, swelling of your face) you may need an additional test to determine which antibiotic should be used during labor. Being treated with an antibiotic during labor greatly reduces the chance that you or  your newborn will develop infections related to GBS. It is important to note that young infants up to age 3 months can also develop septicemia, meningitis and other serious infections from GBS. Being treated with an antibiotic during labor does not reduce the chance that your baby will develop this later type of infection. There is currently no known way of preventing this later-onset GBS disease. WHERE TO GET MORE INFORMATION - Your healthcare provider is the best source of information for questions and concerns related to your medical problem.  

## 2012-11-30 ENCOUNTER — Ambulatory Visit (INDEPENDENT_AMBULATORY_CARE_PROVIDER_SITE_OTHER): Payer: Managed Care, Other (non HMO) | Admitting: Obstetrics & Gynecology

## 2012-11-30 VITALS — BP 103/71 | Temp 98.0°F | Wt 186.8 lb

## 2012-11-30 DIAGNOSIS — Z3480 Encounter for supervision of other normal pregnancy, unspecified trimester: Secondary | ICD-10-CM

## 2012-11-30 DIAGNOSIS — Z3483 Encounter for supervision of other normal pregnancy, third trimester: Secondary | ICD-10-CM

## 2012-11-30 MED ORDER — ZOLPIDEM TARTRATE 5 MG PO TABS: 5.0000 mg | ORAL_TABLET | Freq: Every evening | ORAL | Status: DC | PRN

## 2012-11-30 NOTE — Progress Notes (Signed)
Pulse- 87.  Lots of contractions - no patterns with severe back pain for the last two days.

## 2012-12-07 ENCOUNTER — Encounter: Payer: Self-pay | Admitting: Obstetrics & Gynecology

## 2012-12-07 ENCOUNTER — Ambulatory Visit (INDEPENDENT_AMBULATORY_CARE_PROVIDER_SITE_OTHER): Payer: Managed Care, Other (non HMO) | Admitting: Obstetrics & Gynecology

## 2012-12-07 VITALS — BP 114/75 | Temp 98.1°F | Wt 184.0 lb

## 2012-12-07 DIAGNOSIS — Z3483 Encounter for supervision of other normal pregnancy, third trimester: Secondary | ICD-10-CM

## 2012-12-07 DIAGNOSIS — Z3480 Encounter for supervision of other normal pregnancy, unspecified trimester: Secondary | ICD-10-CM

## 2012-12-07 LAB — POCT URINALYSIS DIPSTICK
Blood, UA: NEGATIVE
Leukocytes, UA: NEGATIVE
Nitrite, UA: NEGATIVE
Protein, UA: NEGATIVE
Urobilinogen, UA: NEGATIVE
pH, UA: 6

## 2012-12-07 NOTE — Progress Notes (Signed)
Pulse- 89 Pt states she is having about several contractions a day. Pt states she is having pain in her lower abdomen, lower back and in her sides. Start biweekly testing next week.

## 2012-12-07 NOTE — Patient Instructions (Signed)
Fetal Monitoring, Nonstress Test The nonstress test (NST) is a procedure that monitors the increase of the fetal heart rate in response to the fetal movement. An increase in the fetal heart rate during fetal movement shows that the pregnancy is normal. The increase also shows the well-being of the baby's central nervous system. Fetal activity may be spontaneous, induced a by uterine contraction, or induced by external stimulation with an artificial voice box. Oxytocin stimulation is not used in this procedure. This test is also done to see if there are problems with the pregnancy and the baby. Identifying and correcting problems may prevent serious problems from developing with the fetus, including fetal loss.  OTHER TECHNIQUES OF MONITORING YOUR BABY PRIOR TO BIRTH:  Fetal movement assessment. This is done by the pregnant woman herself by counting and recording the baby's movements over a certain period of theme.  Contraction stress test (CST). This test monitors the baby's heart rate during a contraction of the uterus.  Fetal biophysical profile (BPP). This measures and evaluates 5 observations of the baby:  The nonstress test.  The baby's breathing.  The baby's movements.  The baby's muscle tone.  The amount of fluid in the amniotic sac.  Modified biophysical profile. This measures the volume of fluid in different parts of the amniotic sac (amniotic fluid index) and the results of the nonstress test.  Umbilical artery doppler velocimetry. This evaluates the blood flow through the umbilical cord. There are several very serious problems that cannot be predicted or detected with any of the fetal monitoring procedures. These problems include separation (abruption) of the placenta and choking of the baby with the umbilical cord (umbilical cord accident). Your caregiver will help you understand the tests and what they mean for you and your baby. It is your responsibility to obtain the results of  your test. LET YOUR CAREGIVER KNOW ABOUT:  Any medications you are taking including prescription and over-the-counter drugs, herbs, eye drops and creams.  If you have a fever.  If you have an infection.  If you are sick. RISKS AND COMPLICATIONS  There are no risks or complications to the mother or fetus with this test. BEFORE THE PROCEDURE  Do not take medications that may increase or decrease the baby's heart rate and/or movements.  Have a full meal at least 2 hours before the test.  Do not smoke if you are pregnant. If you smoke, stop at least 2 days before the test. It is a good idea not to smoke at all when you are pregnant. PROCEDURE The NST is based on the idea that the heart rate of a normal baby will speed up while the baby is moving around. This is an indicator of a normal working pregnancy. Loss of movement is seen commonly while the baby is sleeping or if there are problems in the pregnancy. Smoking may hurt test results.  With the patient lying on her side, the fetal heart rate is checked with an electrode on the belly.  The line drawn from a recording instrument (tracing) is observed for fetal heart rate accelerations that speed up at least 15 beats per minute above resting, and last 15 seconds. Tracings of 40 minutes or longer may be necessary.  Sound stimulation of the healthy fetus may speed up a baby's heart. This also means the baby is healthy. Stimulation helps to reduce testing time and helps find problems in the pregnancy if there is any. To do this, an artificial voice box is   put on the mother's belly for about 12 seconds. This may be repeated up to 3 times for progressively longer durations.  Results are categorized as normal (reactive) or abnormal (nonreactive). Commonly, the NST is considered normal (reactive) if there are 2 or more fetal heart rate accelerations as described inside a 20-minute period. This is with or without fetal movement the mother feels. A  nonreactive NST is one that does not have enough fetal heart rate accelerations over a 40 minute period.  Abnormal testing may need further testing.  Your caregiver will help you understand your tests and what they mean for you and your baby. It should be noted also that:  The NST can be nonreactive 50% of the time in weeks 24 to 28 of a normal pregnancy.  The NST can be nonreactive 15% of the time in weeks 28 to 32 of a normal pregnancy.  Lower fetal heart rate (decelerations) may be seen in 50% of NST's, but if they are consistent (3 in 20 minutes), it increases the risk for Cesarean section.  Decelerations of the fetal heart rate that last for one minute or longer indicates a very serious problem with the baby and a Cesarean section may be needed right away. AFTER THE PROCEDURE  You may go home and resume your usual activities, or as directed by your caregiver. HOME CARE INSTRUCTIONS   Follow your caregiver's advice and recommendations.  Beware of your baby's movements. Are they normal, less than usual or more than usual?  Make and keep the rest of your prenatal appointments. SEEK MEDICAL CARE IF:   You develop a temperature of 100 F (37.9 C) or higher.  You have a bloody mucus discharge from the vagina (a bloody show). SEEK IMMEDIATE MEDICAL CARE IF:   You do not feel the baby move.  You think the baby's movements are less than usual or more than usual.  You develop contractions.  You develop vaginal bleeding.  You develop belly (abdominal) pain.  You have leaking or a gush of fluid from the vagina. Document Released: 01/16/2002 Document Revised: 04/20/2011 Document Reviewed: 05/22/2008 Exit[employer] Patient Information 2014 [employer].  

## 2012-12-12 ENCOUNTER — Ambulatory Visit (INDEPENDENT_AMBULATORY_CARE_PROVIDER_SITE_OTHER): Payer: Managed Care, Other (non HMO) | Admitting: Obstetrics & Gynecology

## 2012-12-12 ENCOUNTER — Encounter: Payer: Self-pay | Admitting: Obstetrics & Gynecology

## 2012-12-12 ENCOUNTER — Other Ambulatory Visit: Payer: Managed Care, Other (non HMO)

## 2012-12-12 VITALS — BP 111/73 | Temp 97.3°F | Wt 187.0 lb

## 2012-12-12 DIAGNOSIS — Z3480 Encounter for supervision of other normal pregnancy, unspecified trimester: Secondary | ICD-10-CM

## 2012-12-12 DIAGNOSIS — O09529 Supervision of elderly multigravida, unspecified trimester: Secondary | ICD-10-CM

## 2012-12-12 DIAGNOSIS — Z3483 Encounter for supervision of other normal pregnancy, third trimester: Secondary | ICD-10-CM

## 2012-12-12 LAB — POCT URINALYSIS DIPSTICK
Blood, UA: NEGATIVE
Ketones, UA: NEGATIVE
Nitrite, UA: NEGATIVE
Spec Grav, UA: 1.015
Urobilinogen, UA: NEGATIVE
pH, UA: 6

## 2012-12-12 NOTE — Progress Notes (Signed)
Pulse- 86 Pt states she has pressure in her lower abdomen.

## 2012-12-14 ENCOUNTER — Inpatient Hospital Stay (HOSPITAL_COMMUNITY)
Admission: AD | Admit: 2012-12-14 | Discharge: 2012-12-16 | DRG: 775 | Disposition: A | Discharge status: Home / Self Care | Payer: Managed Care, Other (non HMO) | Source: Ambulatory Visit | Attending: Obstetrics | Admitting: Obstetrics

## 2012-12-14 ENCOUNTER — Encounter (HOSPITAL_COMMUNITY): Payer: Self-pay

## 2012-12-14 DIAGNOSIS — O09529 Supervision of elderly multigravida, unspecified trimester: Secondary | ICD-10-CM | POA: Diagnosis present

## 2012-12-14 LAB — CBC
HCT: 37.5 % (ref 36.0–46.0)
Hemoglobin: 12.6 g/dL (ref 12.0–15.0)
MCH: 30.2 pg (ref 26.0–34.0)
MCHC: 33.6 g/dL (ref 30.0–36.0)
MCV: 89.9 fL (ref 78.0–100.0)
RBC: 4.17 MIL/uL (ref 3.87–5.11)

## 2012-12-14 LAB — ABO/RH: ABO/RH(D): O POS

## 2012-12-14 LAB — TYPE AND SCREEN

## 2012-12-14 MED ORDER — DIBUCAINE 1 % RE OINT
1.0000 "application " | TOPICAL_OINTMENT | RECTAL | Status: DC | PRN
  Filled 2012-12-14: qty 28

## 2012-12-14 MED ORDER — ONDANSETRON HCL 4 MG PO TABS: 4.0000 mg | ORAL_TABLET | ORAL | Status: DC | PRN

## 2012-12-14 MED ORDER — LIDOCAINE HCL (PF) 1 % IJ SOLN
INTRAMUSCULAR | Status: AC
  Filled 2012-12-14: qty 30

## 2012-12-14 MED ORDER — OXYTOCIN 40 UNITS IN LACTATED RINGERS INFUSION - SIMPLE MED
INTRAVENOUS | Status: AC
  Administered 2012-12-14: 40 [IU]
  Filled 2012-12-14: qty 1000

## 2012-12-14 MED ORDER — SIMETHICONE 80 MG PO CHEW
80.0000 mg | CHEWABLE_TABLET | ORAL | Status: DC | PRN
  Filled 2012-12-14: qty 1

## 2012-12-14 MED ORDER — WITCH HAZEL-GLYCERIN EX PADS: 1.0000 "application " | MEDICATED_PAD | CUTANEOUS | Status: DC | PRN

## 2012-12-14 MED ORDER — TETANUS-DIPHTH-ACELL PERTUSSIS 5-2.5-18.5 LF-MCG/0.5 IM SUSP: 0.5000 mL | Freq: Once | INTRAMUSCULAR | Status: DC

## 2012-12-14 MED ORDER — PRENATAL MULTIVITAMIN CH
1.0000 | ORAL_TABLET | Freq: Every day | ORAL | Status: DC
  Administered 2012-12-14 – 2012-12-16 (×3): 1 via ORAL
  Filled 2012-12-14 (×4): qty 1

## 2012-12-14 MED ORDER — METHYLERGONOVINE MALEATE 0.2 MG PO TABS
0.2000 mg | ORAL_TABLET | Freq: Four times a day (QID) | ORAL | Status: AC
  Administered 2012-12-14 – 2012-12-15 (×6): 0.2 mg via ORAL
  Filled 2012-12-14 (×6): qty 1

## 2012-12-14 MED ORDER — ZOLPIDEM TARTRATE 5 MG PO TABS: 5.0000 mg | ORAL_TABLET | Freq: Every evening | ORAL | Status: DC | PRN

## 2012-12-14 MED ORDER — OXYTOCIN 40 UNITS IN LACTATED RINGERS INFUSION - SIMPLE MED: 62.5000 mL/h | INTRAVENOUS | Status: DC | PRN

## 2012-12-14 MED ORDER — SENNOSIDES-DOCUSATE SODIUM 8.6-50 MG PO TABS
2.0000 | ORAL_TABLET | ORAL | Status: DC
  Administered 2012-12-14 – 2012-12-15 (×2): 2 via ORAL
  Filled 2012-12-14 (×2): qty 2

## 2012-12-14 MED ORDER — IBUPROFEN 600 MG PO TABS
600.0000 mg | ORAL_TABLET | Freq: Four times a day (QID) | ORAL | Status: DC
  Administered 2012-12-14 – 2012-12-16 (×10): 600 mg via ORAL
  Filled 2012-12-14 (×10): qty 1

## 2012-12-14 MED ORDER — LANOLIN HYDROUS EX OINT: TOPICAL_OINTMENT | CUTANEOUS | Status: DC | PRN

## 2012-12-14 MED ORDER — ONDANSETRON HCL 4 MG/2ML IJ SOLN: 4.0000 mg | INTRAMUSCULAR | Status: DC | PRN

## 2012-12-14 MED ORDER — METHYLERGONOVINE MALEATE 0.2 MG/ML IJ SOLN: 0.2000 mg | Freq: Four times a day (QID) | INTRAMUSCULAR | Status: AC

## 2012-12-14 MED ORDER — MISOPROSTOL 200 MCG PO TABS
ORAL_TABLET | ORAL | Status: AC
  Administered 2012-12-14: 1000 ug via RECTAL
  Filled 2012-12-14: qty 5

## 2012-12-14 MED ORDER — DIPHENHYDRAMINE HCL 25 MG PO CAPS: 25.0000 mg | ORAL_CAPSULE | Freq: Four times a day (QID) | ORAL | Status: DC | PRN

## 2012-12-14 MED ORDER — OXYCODONE-ACETAMINOPHEN 5-325 MG PO TABS
1.0000 | ORAL_TABLET | ORAL | Status: DC | PRN
  Administered 2012-12-14: 2 via ORAL
  Administered 2012-12-15 – 2012-12-16 (×2): 1 via ORAL
  Filled 2012-12-14: qty 1
  Filled 2012-12-14: qty 2
  Filled 2012-12-14: qty 1

## 2012-12-14 MED ORDER — BENZOCAINE-MENTHOL 20-0.5 % EX AERO
1.0000 "application " | INHALATION_SPRAY | CUTANEOUS | Status: DC | PRN
  Filled 2012-12-14: qty 56

## 2012-12-14 NOTE — H&P (Signed)
Andrea Dougherty is a 37 y.o. female presenting for UC's. Maternal Medical History:  Reason for admission: Contractions.  [name].  EDC 12-21-12.  Presented to triage with UC's and precipitously delivered as she was getting in bed.  The delivery was attended by the nurse midwife on staff.  Fetal activity: Perceived fetal activity is normal.    Prenatal Complications - Diabetes: none.    OB History   Grav Para Term Preterm Abortions TAB SAB Ect Mult Living   4 3 3       3      Past Medical History  Diagnosis Date  . Medical history non-contributory    Past Surgical History  Procedure Laterality Date  . No past surgeries     Family History: family history is not on file. Social History:  reports that she has never smoked. She has never used smokeless tobacco. She reports that she does not drink alcohol or use illicit drugs.   Prenatal Transfer Tool  Maternal Diabetes: No Genetic Screening: Declined Maternal Ultrasounds/Referrals: Normal Fetal Ultrasounds or other Referrals:  None Maternal Substance Abuse:  No Significant Maternal Medications:  None Significant Maternal Lab Results:  None Other Comments:  AMA  Review of Systems  All other systems reviewed and are negative.      Blood pressure 121/65, pulse 84, temperature 97.5 F (36.4 C), temperature source Oral, last menstrual period 03/16/2012, SpO2 100.00%. Maternal Exam:  Abdomen: Patient reports no abdominal tenderness. Fetal presentation: vertex  Introitus: Normal vulva. Normal vagina.    Physical Exam  Constitutional: She is oriented to person, place, and time. She appears well-developed and well-nourished.  HENT:  Head: Normocephalic and atraumatic.  Neck: Normal range of motion. Neck supple.  Cardiovascular: Normal rate and regular rhythm.   Respiratory: Effort normal.  GI: Soft.  Genitourinary: Vagina normal and uterus normal.  Musculoskeletal: Normal range of motion.  Neurological: She is  alert and oriented to person, place, and time.  Skin: Skin is warm and dry.  Psychiatric: She has a normal mood and affect. Her behavior is normal. Judgment and thought content normal.    Prenatal labs: ABO, Rh: --/--/O POS (11/05 4401) Antibody: PENDING (11/05 0644) Rubella: 13.10 (05/14 0941) RPR: NON REAC (08/13 1344)  HBsAg: NEGATIVE (05/14 0941)  HIV: NON REACTIVE (08/13 1344)  GBS: NEGATIVE (10/09 1837)   Assessment/Plan: 39 weeks.  Precipitous NSVD in triage.  Doing well postpartum.  Routine care.   HARPER,CHARLES A 12/14/2012, 7:29 AM

## 2012-12-14 NOTE — MAU Provider Note (Signed)
Attended precip delivery w/ pt standing next to bed. Steady stream of blood following delivery, ~600 ml. IV, T&S and CBC ordered. Bleeding stopped following delivery of placenta.   IV pitocin and Cytotec PR given. FF, U/3. Dr. Clearance Coots ordered Methergine series.   Lorenzo, CNM 12/14/2012 8:15 AM

## 2012-12-15 ENCOUNTER — Other Ambulatory Visit: Payer: Managed Care, Other (non HMO)

## 2012-12-15 ENCOUNTER — Encounter: Payer: Self-pay | Admitting: Obstetrics & Gynecology

## 2012-12-15 LAB — CBC
Hemoglobin: 11.4 g/dL — ABNORMAL LOW (ref 12.0–15.0)
MCV: 88.5 fL (ref 78.0–100.0)
Platelets: 176 10*3/uL (ref 150–400)
RBC: 3.82 MIL/uL — ABNORMAL LOW (ref 3.87–5.11)
WBC: 9.8 10*3/uL (ref 4.0–10.5)

## 2012-12-15 NOTE — Lactation Note (Signed)
This note was copied from the chart of Andrea Dougherty. Lactation Consultation Note  Patient Name: Andrea Dougherty JYNWG'N Date: 12/15/2012 Reason for consult: Initial assessment of this experienced breastfeeding multipara and her newborn at 8 hours pp.  This is mom's third baby.  Mom states she breastfed first child for 6 months but second child for one year.  Older children are now 58 and 35 yo.  Mom concerned whether she has any milk but reports being able to express colostrum and LC observed baby well-latched to her (R) breast with flanged lips and rhythmical sucking bursts.  LC pointed out the swallow sound for mom and discussed reasons for small amounts of rich colostrum during baby's first few days of life.  LC encouraged STS and cue feedings. LC encouraged review of Baby and Me pp 14 and 20-25 for STS and BF information. LC provided [employer] brochure and reviewed [facility] services and list of community and web site resources.     Maternal Data Formula Feeding for Exclusion: No Infant to breast within first hour of birth: Yes (breastfed for 10 minutes) Has patient been taught Hand Expression?: Yes (mom reports being shown by her nurse and has previous BF experience) Does the patient have breastfeeding experience prior to this delivery?: Yes  Feeding Length of feed: 15 min  LATCH Score/Interventions            Observed baby well-latched on (R) breast in cradle hold with widely flanged lips and rhythmical sucking bursts/swallows noted          Lactation Tools Discussed/Used   STS, cue feedings, hand expression Small newborn stomach and nature of colostrum (sound of swallows)  Consult Status Consult Status: Follow-up Date: 12/16/12 Follow-up type: In-patient    [facility] 12/15/2012, 7:25 PM

## 2012-12-16 MED ORDER — SENNOSIDES-DOCUSATE SODIUM 8.6-50 MG PO TABS: 2.0000 | ORAL_TABLET | ORAL | Status: DC

## 2012-12-16 MED ORDER — IBUPROFEN 600 MG PO TABS: 600.0000 mg | ORAL_TABLET | Freq: Four times a day (QID) | ORAL | Status: DC

## 2012-12-16 NOTE — Discharge Summary (Signed)
Obstetric Discharge Summary Reason for Admission: onset of labor Prenatal Procedures: none Intrapartum Procedures: spontaneous vaginal delivery Postpartum Procedures: none Complications-Operative and Postpartum: vaginal laceration Hemoglobin  Date Value Range Status  12/15/2012 11.4* 12.0 - 15.0 g/dL Final     HCT  Date Value Range Status  12/15/2012 33.8* 36.0 - 46.0 % Final    Physical Exam:  General: alert and cooperative Lochia: appropriate Uterine Fundus: firm Incision: NA DVT Evaluation: No evidence of DVT seen on physical exam.  Discharge Diagnoses: NSVD  Discharge Information: Date: 12/16/2012 Activity: pelvic rest Diet: routine Medications: Ibuprofen and Colace Condition: stable Instructions: refer to practice specific booklet Discharge to: home   Newborn Data: Live born female  Birth Weight: 9 lb 0.8 oz (4105 g) APGAR: 8, 9  Home with mother.  Latif Nazareno 12/16/2012, 1:11 PM

## 2012-12-26 ENCOUNTER — Ambulatory Visit: Payer: Managed Care, Other (non HMO) | Admitting: Obstetrics & Gynecology

## 2013-01-02 ENCOUNTER — Encounter: Payer: Self-pay | Admitting: Obstetrics & Gynecology

## 2013-01-02 ENCOUNTER — Ambulatory Visit (INDEPENDENT_AMBULATORY_CARE_PROVIDER_SITE_OTHER): Payer: Managed Care, Other (non HMO) | Admitting: Obstetrics & Gynecology

## 2013-01-02 MED ORDER — OXYCODONE-ACETAMINOPHEN 5-325 MG PO TABS: 2.0000 | ORAL_TABLET | Freq: Four times a day (QID) | ORAL | Status: DC | PRN

## 2013-01-02 NOTE — Patient Instructions (Signed)
Levonorgestrel intrauterine device (IUD) What is this medicine? LEVONORGESTREL IUD (LEE voe nor jes trel) is a contraceptive (birth control) device. The device is placed inside the uterus by a healthcare professional. It is used to prevent pregnancy and can also be used to treat heavy bleeding that occurs during your period. Depending on the device, it can be used for 3 to 5 years. This medicine may be used for other purposes; ask your health care provider or pharmacist if you have questions. COMMON BRAND NAME(S): Mirena, Skyla What should I tell my health care provider before I take this medicine? They need to know if you have any of these conditions: -abnormal Pap smear -cancer of the breast, uterus, or cervix -diabetes -endometritis -genital or pelvic infection now or in the past -have more than one sexual partner or your partner has more than one partner -heart disease -history of an ectopic or tubal pregnancy -immune system problems -IUD in place -liver disease or tumor -problems with blood clots or take blood-thinners -use intravenous drugs -uterus of unusual shape -vaginal bleeding that has not been explained -an unusual or allergic reaction to levonorgestrel, other hormones, silicone, or polyethylene, medicines, foods, dyes, or preservatives -pregnant or trying to get pregnant -breast-feeding How should I use this medicine? This device is placed inside the uterus by a health care professional. Talk to your pediatrician regarding the use of this medicine in children. Special care may be needed. Overdosage: If you think you have taken too much of this medicine contact a poison control center or emergency room at once. NOTE: This medicine is only for you. Do not share this medicine with others. What if I miss a dose? This does not apply. What may interact with this medicine? Do not take this medicine with any of the following  medications: -amprenavir -bosentan -fosamprenavir This medicine may also interact with the following medications: -aprepitant -barbiturate medicines for inducing sleep or treating seizures -bexarotene -griseofulvin -medicines to treat seizures like carbamazepine, ethotoin, felbamate, oxcarbazepine, phenytoin, topiramate -modafinil -pioglitazone -rifabutin -rifampin -rifapentine -some medicines to treat HIV infection like atazanavir, indinavir, lopinavir, nelfinavir, tipranavir, ritonavir -St. John's wort -warfarin This list may not describe all possible interactions. Give your health care provider a list of all the medicines, herbs, non-prescription drugs, or dietary supplements you use. Also tell them if you smoke, drink alcohol, or use illegal drugs. Some items may interact with your medicine. What should I watch for while using this medicine? Visit your doctor or health care professional for regular check ups. See your doctor if you or your partner has sexual contact with others, becomes HIV positive, or gets a sexual transmitted disease. This product does not protect you against HIV infection (AIDS) or other sexually transmitted diseases. You can check the placement of the IUD yourself by reaching up to the top of your vagina with clean fingers to feel the threads. Do not pull on the threads. It is a good habit to check placement after each menstrual period. Call your doctor right away if you feel more of the IUD than just the threads or if you cannot feel the threads at all. The IUD may come out by itself. You may become pregnant if the device comes out. If you notice that the IUD has come out use a backup birth control method like condoms and call your health care provider. Using tampons will not change the position of the IUD and are okay to use during your period. What side effects may I   notice from receiving this medicine? Side effects that you should report to your doctor or  health care professional as soon as possible: -allergic reactions like skin rash, itching or hives, swelling of the face, lips, or tongue -fever, flu-like symptoms -genital sores -high blood pressure -no menstrual period for 6 weeks during use -pain, swelling, warmth in the leg -pelvic pain or tenderness -severe or sudden headache -signs of pregnancy -stomach cramping -sudden shortness of breath -trouble with balance, talking, or walking -unusual vaginal bleeding, discharge -yellowing of the eyes or skin Side effects that usually do not require medical attention (report to your doctor or health care professional if they continue or are bothersome): -acne -breast pain -change in sex drive or performance -changes in weight -cramping, dizziness, or faintness while the device is being inserted -headache -irregular menstrual bleeding within first 3 to 6 months of use -nausea This list may not describe all possible side effects. Call your doctor for medical advice about side effects. You may report side effects to FDA at 1-800-FDA-1088. Where should I keep my medicine? This does not apply. NOTE: This sheet is a summary. It may not cover all possible information. If you have questions about this medicine, talk to your doctor, pharmacist, or health care provider.  2014, [employer]vier/Gold Standard. (2011-02-26 13:54:04)  

## 2013-01-02 NOTE — Progress Notes (Signed)
Subjective:     Andrea Dougherty is a 37 y.o. female who presents for a postpartum visit. She is 3 weeks postpartum following a spontaneous vaginal delivery. I have fully reviewed the prenatal and intrapartum course. The delivery was at 39 gestational weeks. Outcome: spontaneous vaginal delivery. Anesthesia: none. Postpartum course has been difficult. Baby's course has been going well. Baby is feeding by breast. Bleeding moderate lochia. Bowel function is abnormal: pt states that she has to make herself go to the bathroom. She is unable to take stool softner due to pain.. Bladder function is abnormal: Pt states that she is having problems urinating.. Patient is not sexually active. Contraception method is none. Postpartum depression screening: possible depression  Pt. States that she is having lots of pain in her abdomen and hip area. She is unable to take meds at this time due to pain. Pt states ibuprofen did not help when she was taking.  Pt states that she has also had some swelling in her lower extremities as well. Pt was seen in [facility] last week with pain and swelling. Pt was given lasix and swelling resolved.  The following portions of the patient's history were reviewed and updated as appropriate: allergies, current medications, past family history, past medical history, past social history, past surgical history and problem list.  Review of Systems Pertinent items are noted in HPI.   Objective:  No exam today          Assessment:    Multiple musculoskeletal complaints  Plan:    1. Contraception: return for Mirena IUD insertion 2. Percocet/NSAIDs/heat or ice/rest 3. Follow up in: 1 month or as needed.

## 2013-01-03 ENCOUNTER — Encounter: Payer: Self-pay | Admitting: Obstetrics & Gynecology

## 2013-01-03 ENCOUNTER — Encounter: Payer: Self-pay | Admitting: *Deleted

## 2013-01-11 ENCOUNTER — Encounter: Payer: Self-pay | Admitting: Obstetrics & Gynecology

## 2013-01-24 ENCOUNTER — Encounter: Payer: Self-pay | Admitting: Obstetrics & Gynecology

## 2013-01-26 ENCOUNTER — Ambulatory Visit: Payer: Managed Care, Other (non HMO) | Admitting: Obstetrics & Gynecology

## 2013-01-30 ENCOUNTER — Ambulatory Visit: Payer: Managed Care, Other (non HMO) | Admitting: Obstetrics & Gynecology

## 2013-01-30 ENCOUNTER — Encounter: Payer: Self-pay | Admitting: Obstetrics & Gynecology

## 2013-02-20 ENCOUNTER — Ambulatory Visit (INDEPENDENT_AMBULATORY_CARE_PROVIDER_SITE_OTHER): Payer: Managed Care, Other (non HMO) | Admitting: Obstetrics & Gynecology

## 2013-02-20 ENCOUNTER — Encounter: Payer: Self-pay | Admitting: Obstetrics & Gynecology

## 2013-02-20 VITALS — BP 113/73 | HR 80 | Temp 98.6°F | Ht 61.0 in | Wt 180.0 lb

## 2013-02-20 DIAGNOSIS — Z01818 Encounter for other preprocedural examination: Secondary | ICD-10-CM

## 2013-02-20 LAB — POCT URINE PREGNANCY: Preg Test, Ur: NEGATIVE

## 2013-02-20 NOTE — Progress Notes (Signed)
Subjective:     Andrea Dougherty is a 38 y.o. female who presents for a postpartum visit. She is 2 months postpartum following a spontaneous vaginal delivery. I have fully reviewed the prenatal and intrapartum course. The delivery was at 39.1 gestational weeks. Outcome: spontaneous vaginal delivery. Anesthesia: none. Postpartum course has been normal. Baby's course has been normal. Baby is feeding by breast. Bleeding no bleeding. Bowel function is normal. Bladder function is normal. Patient is sexually active. Contraception method is none. Postpartum depression screening: negative. Patient is also in the office today for IUD insertion. She was sexually active last week without protection but states she his not pregnant.  Abstinence x 2 weeks-->IUD insertion w/negative UPT

## 2013-03-06 ENCOUNTER — Ambulatory Visit: Payer: Managed Care, Other (non HMO) | Admitting: Obstetrics & Gynecology

## 2013-12-11 ENCOUNTER — Encounter: Payer: Self-pay | Admitting: Obstetrics & Gynecology

## 2014-02-05 ENCOUNTER — Encounter: Payer: Self-pay | Admitting: *Deleted

## 2014-02-06 ENCOUNTER — Encounter: Payer: Self-pay | Admitting: Obstetrics & Gynecology

## 2015-07-16 ENCOUNTER — Ambulatory Visit (INDEPENDENT_AMBULATORY_CARE_PROVIDER_SITE_OTHER): Payer: Self-pay | Admitting: Physician Assistant

## 2015-07-16 VITALS — BP 118/82 | HR 87 | Temp 97.8°F | Resp 17 | Ht 61.0 in | Wt 177.0 lb

## 2015-07-16 DIAGNOSIS — Z021 Encounter for pre-employment examination: Secondary | ICD-10-CM

## 2015-07-16 NOTE — Progress Notes (Signed)
   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  She is otherwise healthy.  She has a neg CXR from HD at her current job as she has had a +PPD in the past  She has been employed by [employer] and has been tested for her immunity of MMR, varicella and Hep B  Patient Active Problem List   Diagnosis Date Noted  . Unspecified vitamin D deficiency 06/28/2012    No current outpatient prescriptions on file prior to visit.   No current facility-administered medications on file prior to visit.    No Known Allergies  Review of Systems  Constitutional: Negative.   HENT: Negative.   Eyes: Negative.   Respiratory: Negative.   Cardiovascular: Negative.   Gastrointestinal: Negative.   Endocrine: Negative.   Genitourinary: Negative.   Musculoskeletal: Negative.   Skin: Negative.   Allergic/Immunologic: Negative.   Neurological: Negative.   Hematological: Negative.   Psychiatric/Behavioral: Negative.    Objective:  BP 118/82 mmHg  Pulse 87  Temp(Src) 97.8 F (36.6 C) (Oral)  Resp 17  Ht '5\' 1"'$  (1.549 m)  Wt 177 lb (80.287 kg)  BMI 33.46 kg/m2  SpO2 100%  Breastfeeding? No  Physical Exam  Constitutional: She is oriented to person, place, and time and well-developed, well-nourished, and in no distress.  HENT:  Head: Normocephalic and atraumatic.  Right Ear: Hearing and external ear normal.  Left Ear: Hearing and external ear normal.  Eyes: Conjunctivae are normal.  Neck: Normal range of motion.  Cardiovascular: Normal rate, regular rhythm and normal heart sounds.   No murmur heard. Pulmonary/Chest: Effort normal and breath sounds normal. She has no wheezes.  Abdominal: Soft. Bowel sounds are normal. There is no tenderness.  Neurological: She is alert and oriented to person, place, and time. She has  normal sensation, normal strength and normal reflexes. She displays normal reflexes. Gait normal.  Skin: Skin is warm and dry.  Psychiatric: Mood, memory, affect and judgment normal.  Vitals reviewed.   Assessment and Plan :  Physical exam, pre-employment  Form filled out -   Windell Hummingbird PA-C  [facility] 07/16/2015 10:09 AM

## 2015-07-16 NOTE — Patient Instructions (Signed)
     IF you received an x-ray today, you will receive an invoice from [facility]. Please contact [facility] at [phone] with questions or concerns regarding your invoice.   IF you received labwork today, you will receive an invoice from [facility]. Please contact [facility] at [phone] with questions or concerns regarding your invoice.   Our billing staff will not be able to assist you with questions regarding bills from these companies.  You will be contacted with the lab results as soon as they are available. The fastest way to get your results is to activate your My Chart account. Instructions are located on the last page of this paperwork. If you have not heard from us regarding the results in 2 weeks, please contact this office.      

## 2017-12-14 ENCOUNTER — Other Ambulatory Visit (HOSPITAL_COMMUNITY)
Admission: RE | Admit: 2017-12-14 | Discharge: 2017-12-14 | Disposition: A | Discharge status: Home / Self Care | Payer: No Typology Code available for payment source | Source: Ambulatory Visit | Attending: Obstetrics and Gynecology | Admitting: Obstetrics and Gynecology

## 2017-12-14 ENCOUNTER — Other Ambulatory Visit: Payer: Self-pay | Admitting: Obstetrics and Gynecology

## 2017-12-14 DIAGNOSIS — Z01411 Encounter for gynecological examination (general) (routine) with abnormal findings: Secondary | ICD-10-CM | POA: Insufficient documentation

## 2017-12-14 DIAGNOSIS — Z1231 Encounter for screening mammogram for malignant neoplasm of breast: Secondary | ICD-10-CM

## 2017-12-16 LAB — CYTOLOGY - PAP
DIAGNOSIS: NEGATIVE
HPV (WINDOPATH): NOT DETECTED

## 2018-01-25 ENCOUNTER — Ambulatory Visit
Admission: RE | Admit: 2018-01-25 | Discharge: 2018-01-25 | Disposition: A | Discharge status: Home / Self Care | Payer: No Typology Code available for payment source | Source: Ambulatory Visit | Attending: Obstetrics and Gynecology | Admitting: Obstetrics and Gynecology

## 2018-01-25 DIAGNOSIS — Z1231 Encounter for screening mammogram for malignant neoplasm of breast: Secondary | ICD-10-CM

## 2020-05-07 IMAGING — MG DIGITAL SCREENING BILATERAL MAMMOGRAM WITH CAD
4 series · 4 of 4 positions shown · non-contrast
Comparison: None.

CLINICAL DATA: Screening.

EXAM:
DIGITAL SCREENING BILATERAL MAMMOGRAM WITH CAD

[L CC]
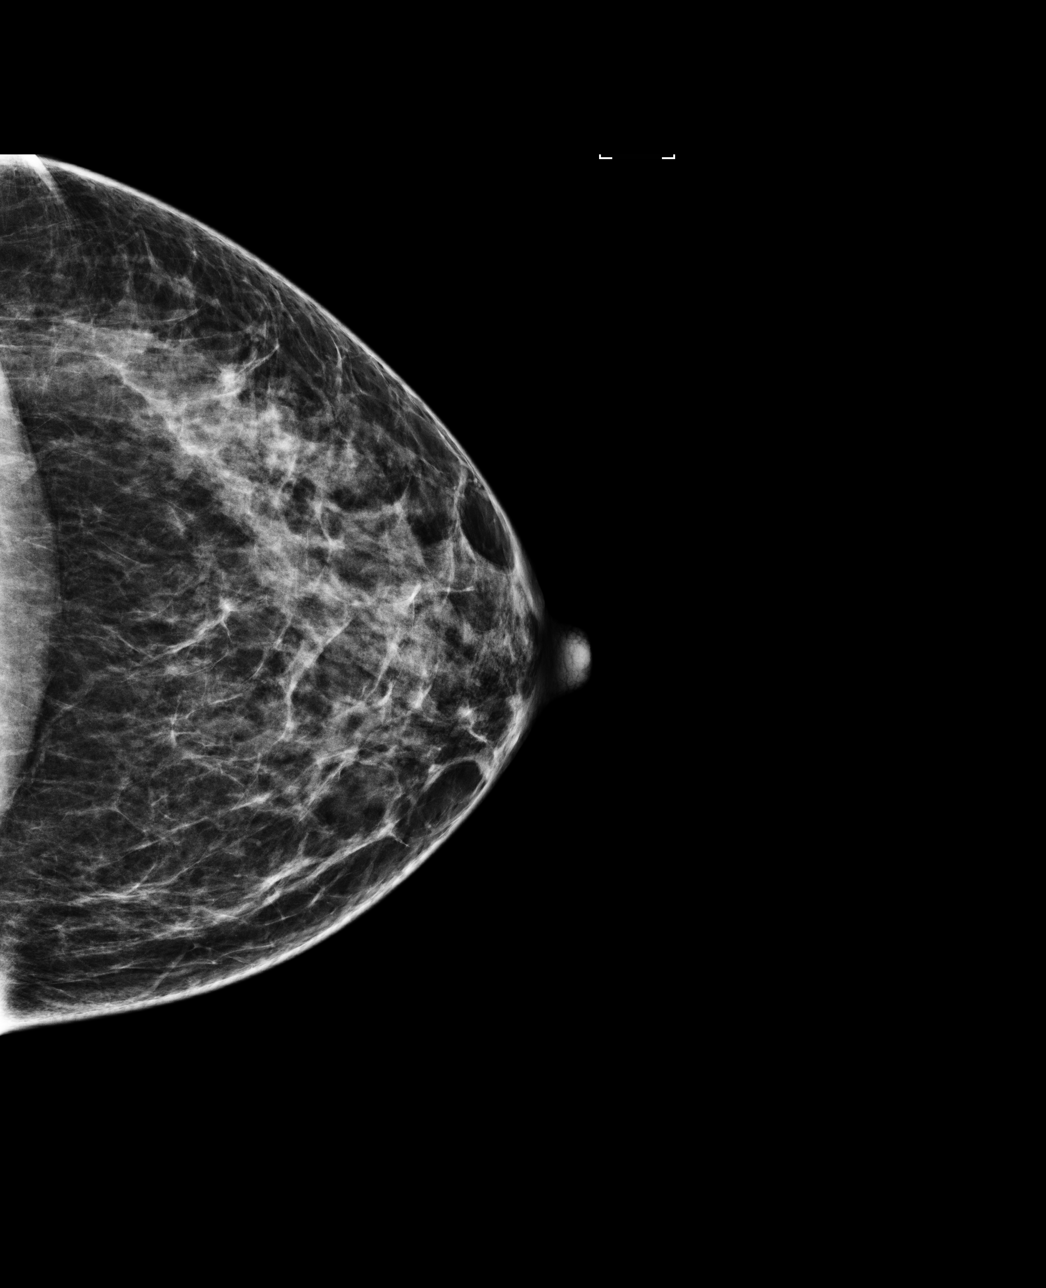

[R CC]
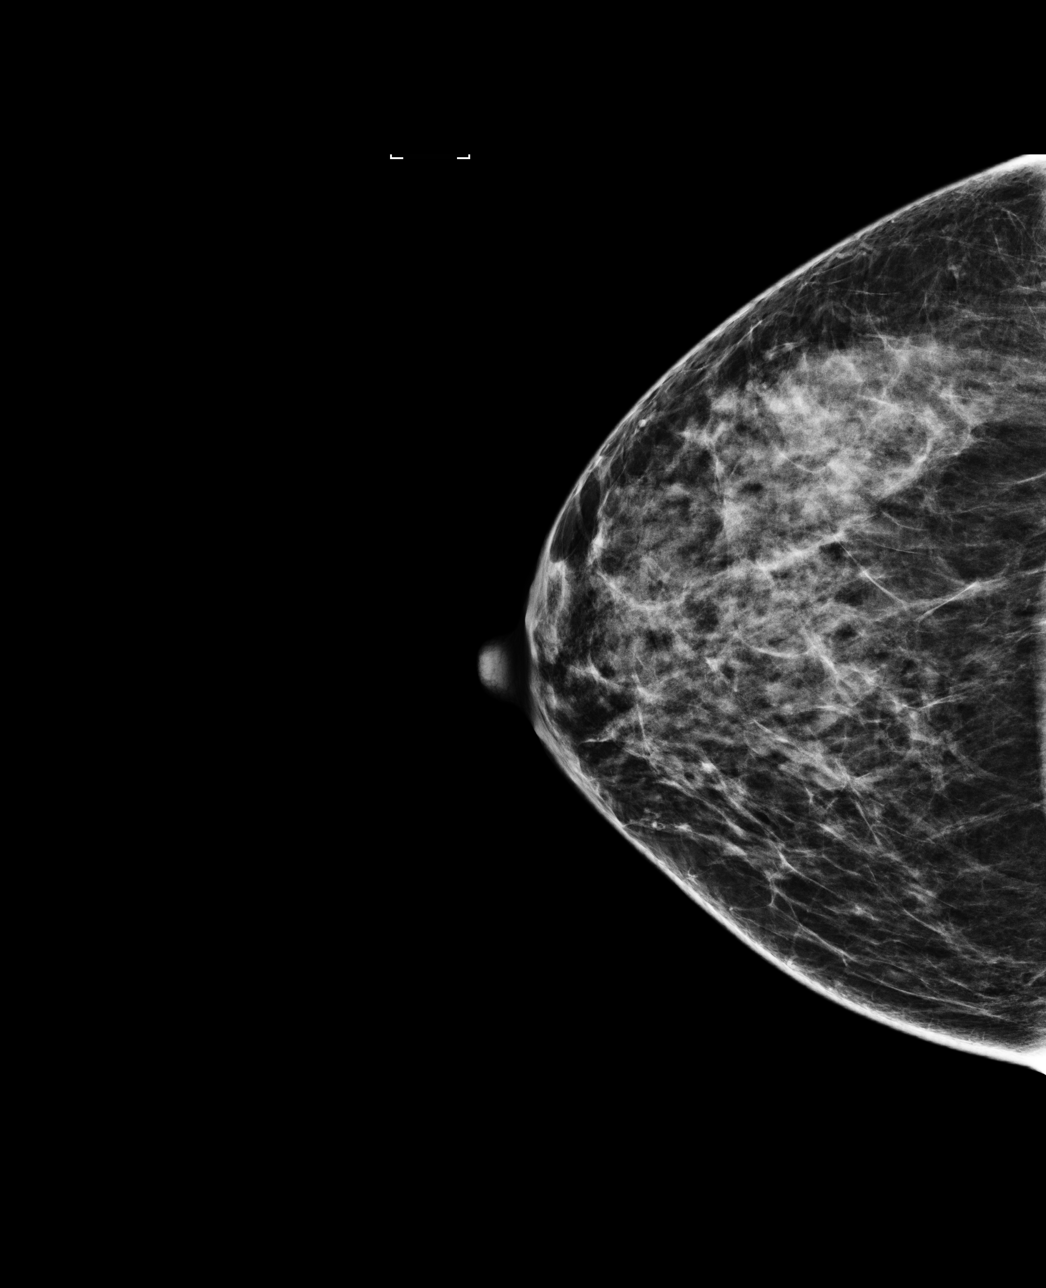

[L MLO]
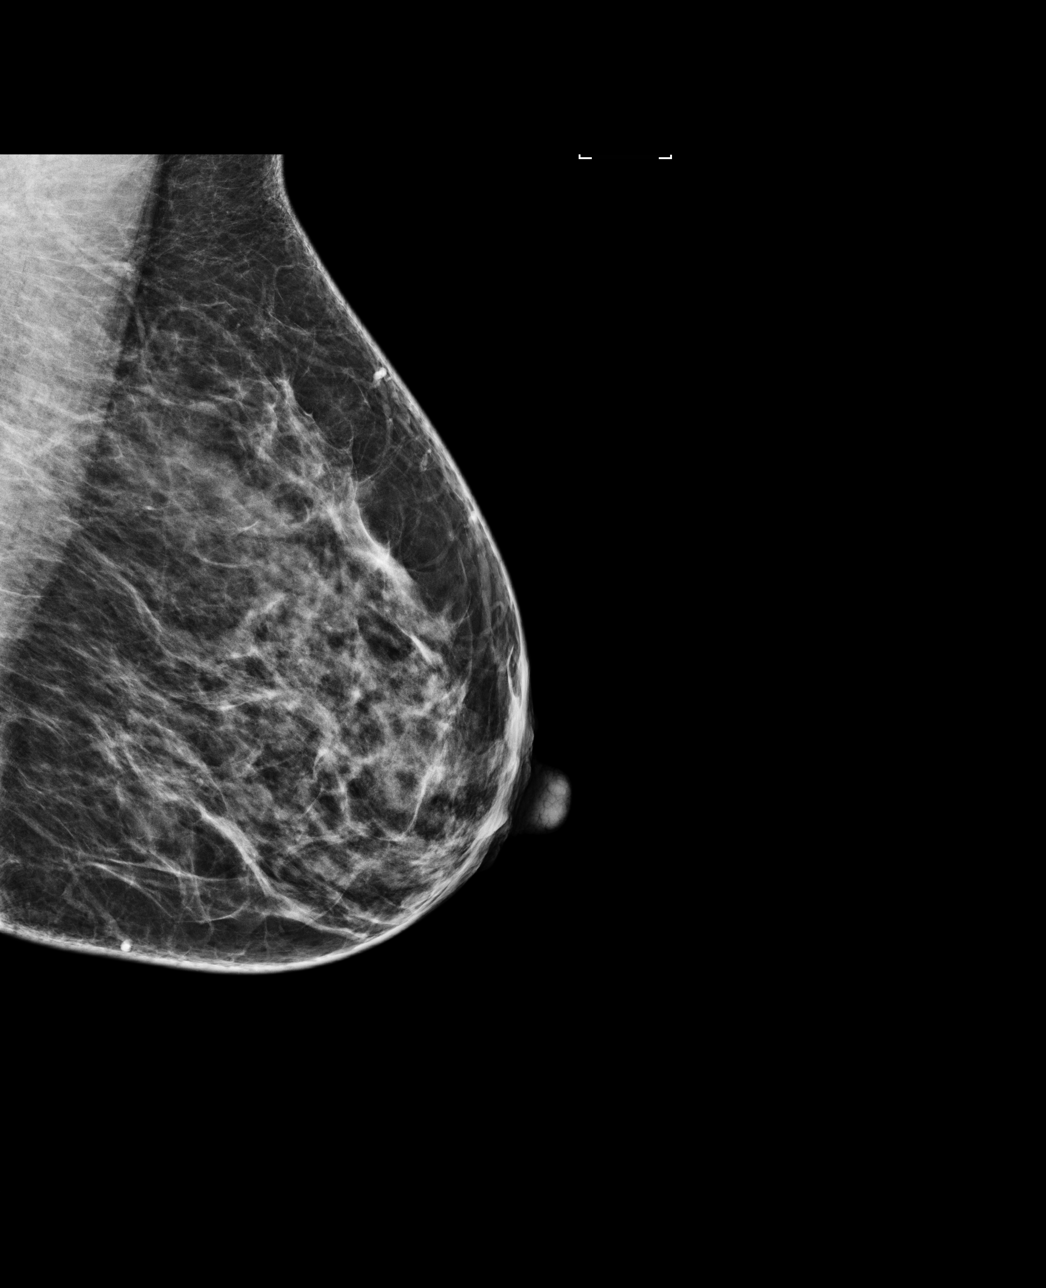

[R MLO]
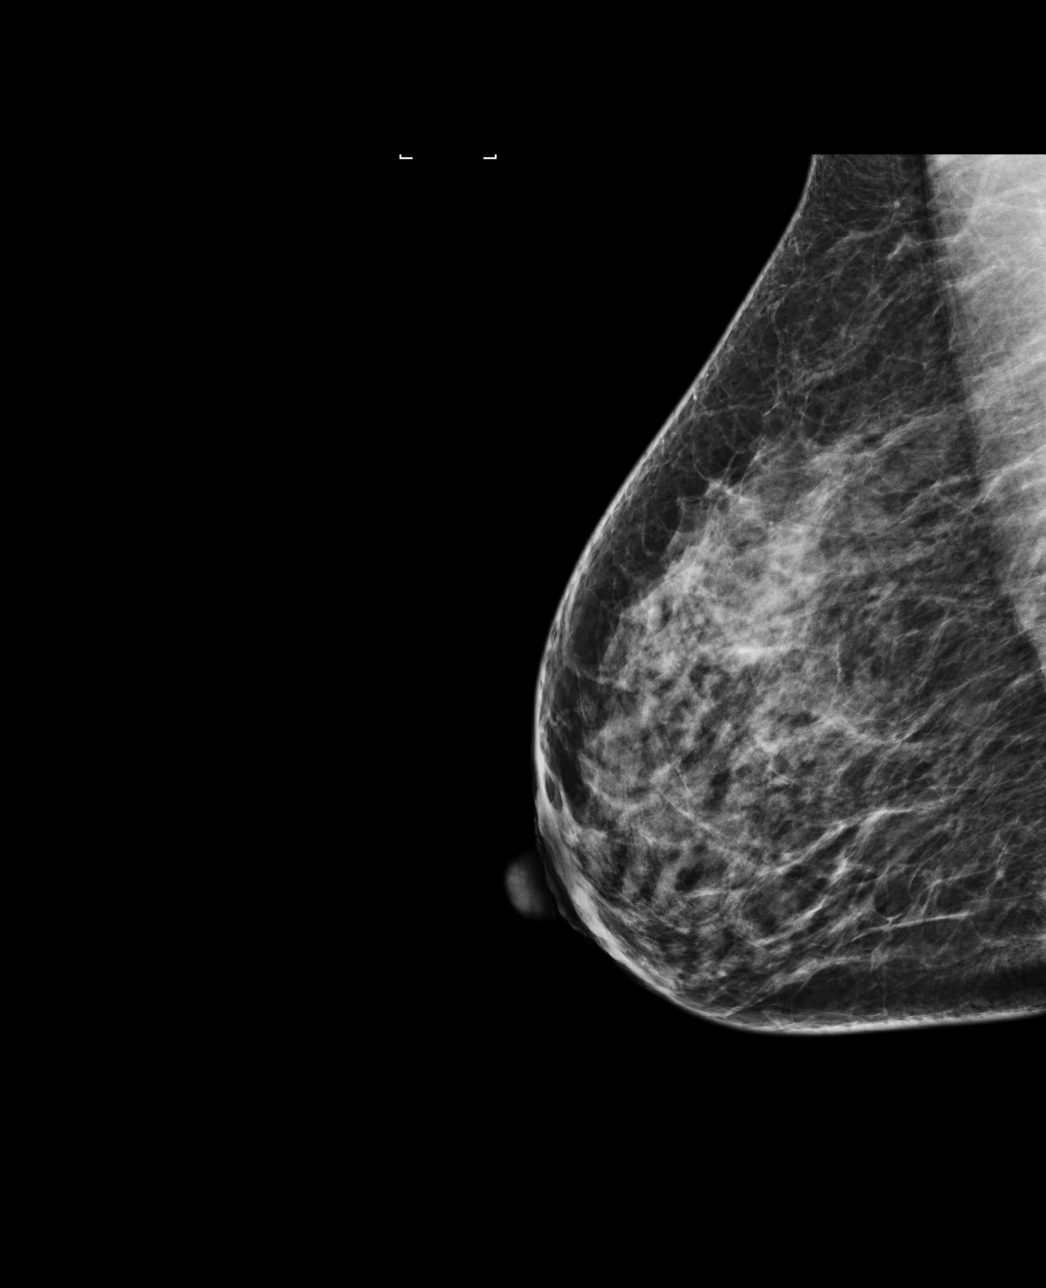

[4 of 4 positions shown; findings below may reference images not displayed]

ACR Breast Density Category c: The breast tissue is heterogeneously
dense, which may obscure small masses
FINDINGS: There are no findings suspicious for malignancy. Images were
processed with CAD.
IMPRESSION: No mammographic evidence of malignancy. A result letter of this
screening mammogram will be mailed directly to the patient.

RECOMMENDATION:
Screening mammogram in one year. (Code:[id number])

BI-RADS CATEGORY  1: Negative.
